# Patient Record
Sex: Male | Born: 1975 | Race: Black or African American | Hispanic: No | Marital: Married | Smoking: Former smoker
Health system: Southern US, Community
[De-identification: ages and names within clinical notes are randomized; demographics above are authoritative.]

## PROBLEM LIST (undated history)

## (undated) DIAGNOSIS — M1990 Unspecified osteoarthritis, unspecified site: Secondary | ICD-10-CM

---

## 1997-06-23 ENCOUNTER — Emergency Department (HOSPITAL_COMMUNITY): Admission: EM | Admit: 1997-06-23 | Discharge: 1997-06-23 | Payer: Self-pay | Admitting: Emergency Medicine

## 1997-07-19 ENCOUNTER — Emergency Department (HOSPITAL_COMMUNITY): Admission: AD | Admit: 1997-07-19 | Discharge: 1997-07-19 | Payer: Self-pay

## 1997-11-20 ENCOUNTER — Emergency Department (HOSPITAL_COMMUNITY): Admission: EM | Admit: 1997-11-20 | Discharge: 1997-11-20 | Payer: Self-pay | Admitting: Emergency Medicine

## 1998-03-09 ENCOUNTER — Emergency Department (HOSPITAL_COMMUNITY): Admission: EM | Admit: 1998-03-09 | Discharge: 1998-03-09 | Payer: Self-pay | Admitting: Emergency Medicine

## 1998-12-10 ENCOUNTER — Emergency Department (HOSPITAL_COMMUNITY): Admission: EM | Admit: 1998-12-10 | Discharge: 1998-12-10 | Payer: Self-pay | Admitting: Emergency Medicine

## 1998-12-11 ENCOUNTER — Emergency Department (HOSPITAL_COMMUNITY): Admission: EM | Admit: 1998-12-11 | Discharge: 1998-12-11 | Payer: Self-pay | Admitting: Emergency Medicine

## 1998-12-11 ENCOUNTER — Encounter: Payer: Self-pay | Admitting: Emergency Medicine

## 1998-12-13 ENCOUNTER — Inpatient Hospital Stay (HOSPITAL_COMMUNITY): Admission: EM | Admit: 1998-12-13 | Discharge: 1998-12-17 | Payer: Self-pay | Admitting: Emergency Medicine

## 1998-12-16 ENCOUNTER — Encounter: Payer: Self-pay | Admitting: Internal Medicine

## 1999-08-15 ENCOUNTER — Emergency Department (HOSPITAL_COMMUNITY): Admission: EM | Admit: 1999-08-15 | Discharge: 1999-08-15 | Payer: Self-pay | Admitting: Emergency Medicine

## 1999-08-18 ENCOUNTER — Encounter: Admission: RE | Admit: 1999-08-18 | Discharge: 1999-08-18 | Payer: Self-pay | Admitting: Hematology and Oncology

## 2001-06-12 ENCOUNTER — Emergency Department (HOSPITAL_COMMUNITY): Admission: EM | Admit: 2001-06-12 | Discharge: 2001-06-12 | Payer: Self-pay | Admitting: Emergency Medicine

## 2001-06-13 ENCOUNTER — Emergency Department (HOSPITAL_COMMUNITY): Admission: EM | Admit: 2001-06-13 | Discharge: 2001-06-13 | Payer: Self-pay | Admitting: Emergency Medicine

## 2001-10-15 ENCOUNTER — Emergency Department (HOSPITAL_COMMUNITY): Admission: EM | Admit: 2001-10-15 | Discharge: 2001-10-15 | Payer: Self-pay | Admitting: Emergency Medicine

## 2001-10-18 ENCOUNTER — Emergency Department (HOSPITAL_COMMUNITY): Admission: EM | Admit: 2001-10-18 | Discharge: 2001-10-18 | Payer: Self-pay | Admitting: Emergency Medicine

## 2002-08-05 ENCOUNTER — Emergency Department (HOSPITAL_COMMUNITY): Admission: EM | Admit: 2002-08-05 | Discharge: 2002-08-05 | Payer: Self-pay | Admitting: Emergency Medicine

## 2003-04-14 ENCOUNTER — Emergency Department (HOSPITAL_COMMUNITY): Admission: EM | Admit: 2003-04-14 | Discharge: 2003-04-14 | Payer: Self-pay | Admitting: Emergency Medicine

## 2010-03-28 ENCOUNTER — Emergency Department (HOSPITAL_COMMUNITY)
Admission: EM | Admit: 2010-03-28 | Discharge: 2010-03-29 | Disposition: A | Discharge status: Home / Self Care | Payer: Self-pay | Attending: Emergency Medicine | Admitting: Emergency Medicine

## 2010-03-28 DIAGNOSIS — R109 Unspecified abdominal pain: Secondary | ICD-10-CM | POA: Insufficient documentation

## 2010-03-28 DIAGNOSIS — R112 Nausea with vomiting, unspecified: Secondary | ICD-10-CM | POA: Insufficient documentation

## 2010-03-28 DIAGNOSIS — R509 Fever, unspecified: Secondary | ICD-10-CM | POA: Insufficient documentation

## 2010-03-29 LAB — URINALYSIS, ROUTINE W REFLEX MICROSCOPIC
Hgb urine dipstick: NEGATIVE
Ketones, ur: >80 mg/dL — AB
Leukocytes, UA: NEGATIVE
Nitrite: NEGATIVE
Protein, ur: 30 mg/dL — AB
Specific Gravity, Urine: 1.034 — ABNORMAL HIGH (ref 1.005–1.030)
Urine Glucose, Fasting: NEGATIVE mg/dL
Urobilinogen, UA: 1 mg/dL (ref 0.0–1.0)
pH: 7 (ref 5.0–8.0)

## 2010-03-29 LAB — CBC
HCT: 48.3 % (ref 39.0–52.0)
Hemoglobin: 16.3 g/dL (ref 13.0–17.0)
MCH: 29.1 pg (ref 26.0–34.0)
MCHC: 33.7 g/dL (ref 30.0–36.0)
MCV: 86.3 fL (ref 78.0–100.0)
Platelets: 170 10*3/uL (ref 150–400)
RBC: 5.6 MIL/uL (ref 4.22–5.81)
RDW: 14.3 % (ref 11.5–15.5)
WBC: 9.7 10*3/uL (ref 4.0–10.5)

## 2010-03-29 LAB — COMPREHENSIVE METABOLIC PANEL
ALT: 44 U/L (ref 0–53)
AST: 32 U/L (ref 0–37)
CO2: 24 mEq/L (ref 19–32)
Calcium: 9.7 mg/dL (ref 8.4–10.5)
Creatinine, Ser: 1.58 mg/dL — ABNORMAL HIGH (ref 0.4–1.5)
GFR calc Af Amer: >60 mL/min (ref >60)
GFR calc non Af Amer: 51 mL/min — ABNORMAL LOW (ref >60)
Sodium: 141 mEq/L (ref 135–145)
Total Protein: 8.8 g/dL — ABNORMAL HIGH (ref 6.0–8.3)

## 2010-03-29 LAB — URINE MICROSCOPIC-ADD ON

## 2010-03-29 LAB — DIFFERENTIAL
Basophils Absolute: 0 10*3/uL (ref 0.0–0.1)
Basophils Relative: 0 % (ref 0–1)
Eosinophils Absolute: 0.1 10*3/uL (ref 0.0–0.7)
Eosinophils Relative: 1 % (ref 0–5)
Lymphocytes Relative: 17 % (ref 12–46)
Lymphs Abs: 1.7 10*3/uL (ref 0.7–4.0)
Monocytes Absolute: 1 10*3/uL (ref 0.1–1.0)
Monocytes Relative: 11 % (ref 3–12)
Neutro Abs: 6.9 10*3/uL (ref 1.7–7.7)
Neutrophils Relative %: 71 % (ref 43–77)

## 2010-03-31 ENCOUNTER — Emergency Department (HOSPITAL_COMMUNITY): Payer: Self-pay

## 2010-03-31 ENCOUNTER — Emergency Department (HOSPITAL_COMMUNITY)
Admission: EM | Admit: 2010-03-31 | Discharge: 2010-03-31 | Disposition: A | Discharge status: Home / Self Care | Payer: Self-pay | Attending: Emergency Medicine | Admitting: Emergency Medicine

## 2010-03-31 DIAGNOSIS — R109 Unspecified abdominal pain: Secondary | ICD-10-CM | POA: Insufficient documentation

## 2010-03-31 DIAGNOSIS — R10819 Abdominal tenderness, unspecified site: Secondary | ICD-10-CM | POA: Insufficient documentation

## 2010-03-31 DIAGNOSIS — R112 Nausea with vomiting, unspecified: Secondary | ICD-10-CM | POA: Insufficient documentation

## 2010-03-31 LAB — DIFFERENTIAL
Basophils Absolute: 0.1 10*3/uL (ref 0.0–0.1)
Lymphocytes Relative: 28 % (ref 12–46)
Monocytes Relative: 9 % (ref 3–12)
Neutrophils Relative %: 62 % (ref 43–77)

## 2010-03-31 LAB — COMPREHENSIVE METABOLIC PANEL
ALT: 31 U/L (ref 0–53)
Albumin: 4.5 g/dL (ref 3.5–5.2)
Alkaline Phosphatase: 81 U/L (ref 39–117)
Chloride: 108 mEq/L (ref 96–112)
Glucose, Bld: 115 mg/dL — ABNORMAL HIGH (ref 70–99)
Potassium: 3.5 mEq/L (ref 3.5–5.1)
Sodium: 142 mEq/L (ref 135–145)
Total Bilirubin: 1.2 mg/dL (ref 0.3–1.2)
Total Protein: 8.8 g/dL — ABNORMAL HIGH (ref 6.0–8.3)

## 2010-03-31 LAB — URINALYSIS, ROUTINE W REFLEX MICROSCOPIC
Hgb urine dipstick: NEGATIVE
Ketones, ur: >80 mg/dL — AB
Protein, ur: 30 mg/dL — AB
Urine Glucose, Fasting: NEGATIVE mg/dL
Urobilinogen, UA: 1 mg/dL (ref 0.0–1.0)

## 2010-03-31 LAB — CBC
HCT: 48.4 % (ref 39.0–52.0)
Hemoglobin: 16.5 g/dL (ref 13.0–17.0)
RBC: 5.55 MIL/uL (ref 4.22–5.81)
RDW: 14.1 % (ref 11.5–15.5)
WBC: 9.7 10*3/uL (ref 4.0–10.5)

## 2010-03-31 LAB — URINE MICROSCOPIC-ADD ON

## 2010-07-05 ENCOUNTER — Emergency Department (HOSPITAL_COMMUNITY): Payer: Self-pay

## 2010-07-05 ENCOUNTER — Emergency Department (HOSPITAL_COMMUNITY)
Admission: EM | Admit: 2010-07-05 | Discharge: 2010-07-05 | Disposition: A | Discharge status: Home / Self Care | Payer: Self-pay | Attending: Emergency Medicine | Admitting: Emergency Medicine

## 2010-07-05 DIAGNOSIS — R112 Nausea with vomiting, unspecified: Secondary | ICD-10-CM | POA: Insufficient documentation

## 2010-07-05 DIAGNOSIS — R109 Unspecified abdominal pain: Secondary | ICD-10-CM | POA: Insufficient documentation

## 2010-07-05 LAB — CBC
Hemoglobin: 17.4 g/dL — ABNORMAL HIGH (ref 13.0–17.0)
MCH: 30.1 pg (ref 26.0–34.0)
RBC: 5.79 MIL/uL (ref 4.22–5.81)

## 2010-07-05 LAB — URINALYSIS, ROUTINE W REFLEX MICROSCOPIC
Bilirubin Urine: NEGATIVE
Hgb urine dipstick: NEGATIVE
Ketones, ur: >80 mg/dL — AB
Protein, ur: 30 mg/dL — AB
Urobilinogen, UA: 1 mg/dL (ref 0.0–1.0)

## 2010-07-05 LAB — OCCULT BLOOD, POC DEVICE: Fecal Occult Bld: NEGATIVE

## 2010-07-05 LAB — DIFFERENTIAL
Basophils Relative: 0 % (ref 0–1)
Lymphocytes Relative: 23 % (ref 12–46)
Lymphs Abs: 2.1 10*3/uL (ref 0.7–4.0)
Monocytes Relative: 11 % (ref 3–12)
Neutro Abs: 5.9 10*3/uL (ref 1.7–7.7)
Neutrophils Relative %: 65 % (ref 43–77)

## 2010-07-05 LAB — COMPREHENSIVE METABOLIC PANEL
ALT: 57 U/L — ABNORMAL HIGH (ref 0–53)
AST: 74 U/L — ABNORMAL HIGH (ref 0–37)
CO2: 22 mEq/L (ref 19–32)
Chloride: 99 mEq/L (ref 96–112)
Creatinine, Ser: 1.41 mg/dL (ref 0.4–1.5)
GFR calc Af Amer: >60 mL/min (ref >60)
GFR calc non Af Amer: 58 mL/min — ABNORMAL LOW (ref >60)
Total Bilirubin: 1 mg/dL (ref 0.3–1.2)

## 2010-07-05 MED ORDER — IOHEXOL 300 MG/ML  SOLN
125.0000 mL | Freq: Once | INTRAMUSCULAR | Status: AC | PRN
  Administered 2010-07-05: 125 mL via INTRAVENOUS

## 2012-08-16 ENCOUNTER — Emergency Department (HOSPITAL_COMMUNITY): Payer: No Typology Code available for payment source

## 2012-08-16 ENCOUNTER — Encounter (HOSPITAL_COMMUNITY): Payer: Self-pay | Admitting: Emergency Medicine

## 2012-08-16 ENCOUNTER — Emergency Department (HOSPITAL_COMMUNITY)
Admission: EM | Admit: 2012-08-16 | Discharge: 2012-08-16 | Disposition: A | Discharge status: Home / Self Care | Payer: No Typology Code available for payment source | Attending: Emergency Medicine | Admitting: Emergency Medicine

## 2012-08-16 DIAGNOSIS — Y92410 Unspecified street and highway as the place of occurrence of the external cause: Secondary | ICD-10-CM | POA: Insufficient documentation

## 2012-08-16 DIAGNOSIS — Z8739 Personal history of other diseases of the musculoskeletal system and connective tissue: Secondary | ICD-10-CM | POA: Insufficient documentation

## 2012-08-16 DIAGNOSIS — Y9389 Activity, other specified: Secondary | ICD-10-CM | POA: Insufficient documentation

## 2012-08-16 DIAGNOSIS — S0993XA Unspecified injury of face, initial encounter: Secondary | ICD-10-CM | POA: Insufficient documentation

## 2012-08-16 DIAGNOSIS — IMO0002 Reserved for concepts with insufficient information to code with codable children: Secondary | ICD-10-CM | POA: Insufficient documentation

## 2012-08-16 DIAGNOSIS — Z87891 Personal history of nicotine dependence: Secondary | ICD-10-CM | POA: Insufficient documentation

## 2012-08-16 HISTORY — DX: Unspecified osteoarthritis, unspecified site: M19.90

## 2012-08-16 MED ORDER — TRAMADOL HCL 50 MG PO TABS: 50.0000 mg | ORAL_TABLET | Freq: Four times a day (QID) | ORAL | Status: AC | PRN

## 2012-08-16 MED ORDER — KETOROLAC TROMETHAMINE 30 MG/ML IJ SOLN
30.0000 mg | Freq: Once | INTRAMUSCULAR | Status: AC
  Administered 2012-08-16: 30 mg via INTRAVENOUS
  Filled 2012-08-16: qty 1
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
## 2012-08-16 NOTE — ED Notes (Signed)
C-collar removed by Dr. Lockwood 

## 2012-08-16 NOTE — ED Notes (Signed)
Pt sts staff can throw away the razor blade, he doesn't need it anymore and doesn't know how he got it in his shoe. This RN put the blade in the sharps container.
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
## 2016-07-18 ENCOUNTER — Encounter (HOSPITAL_COMMUNITY): Payer: Self-pay | Admitting: Emergency Medicine

## 2016-07-18 ENCOUNTER — Emergency Department (HOSPITAL_COMMUNITY)
Admission: EM | Admit: 2016-07-18 | Discharge: 2016-07-18 | Disposition: A | Discharge status: Home / Self Care | Payer: Self-pay | Attending: Emergency Medicine | Admitting: Emergency Medicine

## 2016-07-18 DIAGNOSIS — S80811A Abrasion, right lower leg, initial encounter: Secondary | ICD-10-CM | POA: Insufficient documentation

## 2016-07-18 DIAGNOSIS — S81812A Laceration without foreign body, left lower leg, initial encounter: Secondary | ICD-10-CM | POA: Insufficient documentation

## 2016-07-18 DIAGNOSIS — Y999 Unspecified external cause status: Secondary | ICD-10-CM | POA: Insufficient documentation

## 2016-07-18 DIAGNOSIS — R0789 Other chest pain: Secondary | ICD-10-CM | POA: Insufficient documentation

## 2016-07-18 DIAGNOSIS — Y929 Unspecified place or not applicable: Secondary | ICD-10-CM | POA: Insufficient documentation

## 2016-07-18 DIAGNOSIS — Z23 Encounter for immunization: Secondary | ICD-10-CM | POA: Insufficient documentation

## 2016-07-18 DIAGNOSIS — Z87891 Personal history of nicotine dependence: Secondary | ICD-10-CM | POA: Insufficient documentation

## 2016-07-18 DIAGNOSIS — Z79899 Other long term (current) drug therapy: Secondary | ICD-10-CM | POA: Insufficient documentation

## 2016-07-18 DIAGNOSIS — Y939 Activity, unspecified: Secondary | ICD-10-CM | POA: Insufficient documentation

## 2016-07-18 MED ORDER — TETANUS-DIPHTH-ACELL PERTUSSIS 5-2.5-18.5 LF-MCG/0.5 IM SUSP: 0.5000 mL | Freq: Once | INTRAMUSCULAR | Status: DC

## 2016-07-18 MED ORDER — LIDOCAINE-EPINEPHRINE (PF) 2 %-1:200000 IJ SOLN
20.0000 mL | Freq: Once | INTRAMUSCULAR | Status: DC
Start: 2016-07-18 — End: 2016-07-18

## 2016-07-18 NOTE — ED Notes (Signed)
Wound cleaned and flushed with NS.  Slight closure provided with ster strips.  Xeroform dressing with 4/4 applied.  Secured.  Pt refused Tdap injection.
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
## 2017-04-01 ENCOUNTER — Emergency Department (HOSPITAL_COMMUNITY): Payer: Self-pay

## 2017-04-01 ENCOUNTER — Emergency Department (HOSPITAL_COMMUNITY)
Admission: EM | Admit: 2017-04-01 | Discharge: 2017-04-01 | Disposition: A | Discharge status: Home / Self Care | Payer: Self-pay | Attending: Emergency Medicine | Admitting: Emergency Medicine

## 2017-04-01 DIAGNOSIS — R6889 Other general symptoms and signs: Secondary | ICD-10-CM

## 2017-04-01 DIAGNOSIS — J111 Influenza due to unidentified influenza virus with other respiratory manifestations: Secondary | ICD-10-CM | POA: Insufficient documentation

## 2017-04-01 DIAGNOSIS — Z87891 Personal history of nicotine dependence: Secondary | ICD-10-CM | POA: Insufficient documentation

## 2017-04-01 LAB — CBC WITH DIFFERENTIAL/PLATELET
BASOS ABS: 0 10*3/uL (ref 0.0–0.1)
BASOS PCT: 0 %
Eosinophils Absolute: 0 10*3/uL (ref 0.0–0.7)
Eosinophils Relative: 0 %
HEMATOCRIT: 46.3 % (ref 39.0–52.0)
HEMOGLOBIN: 16 g/dL (ref 13.0–17.0)
LYMPHS PCT: 21 %
Lymphs Abs: 1.8 10*3/uL (ref 0.7–4.0)
MCH: 30.5 pg (ref 26.0–34.0)
MCHC: 34.6 g/dL (ref 30.0–36.0)
MCV: 88.4 fL (ref 78.0–100.0)
MONOS PCT: 4 %
Monocytes Absolute: 0.3 10*3/uL (ref 0.1–1.0)
NEUTROS PCT: 75 %
Neutro Abs: 6.4 10*3/uL (ref 1.7–7.7)
Platelets: 153 10*3/uL (ref 150–400)
RBC: 5.24 MIL/uL (ref 4.22–5.81)
RDW: 14.3 % (ref 11.5–15.5)
WBC: 8.5 10*3/uL (ref 4.0–10.5)

## 2017-04-01 LAB — COMPREHENSIVE METABOLIC PANEL
ALBUMIN: 4.3 g/dL (ref 3.5–5.0)
ALK PHOS: 76 U/L (ref 38–126)
ALT: 28 U/L (ref 17–63)
ANION GAP: 14 (ref 5–15)
AST: 32 U/L (ref 15–41)
BUN: 18 mg/dL (ref 6–20)
CALCIUM: 9.2 mg/dL (ref 8.9–10.3)
CO2: 16 mmol/L — AB (ref 22–32)
Chloride: 106 mmol/L (ref 101–111)
Creatinine, Ser: 2.26 mg/dL — ABNORMAL HIGH (ref 0.61–1.24)
GFR calc non Af Amer: 35 mL/min — ABNORMAL LOW (ref >60)
GFR, EST AFRICAN AMERICAN: 40 mL/min — AB (ref >60)
Glucose, Bld: 121 mg/dL — ABNORMAL HIGH (ref 65–99)
POTASSIUM: 3 mmol/L — AB (ref 3.5–5.1)
SODIUM: 136 mmol/L (ref 135–145)
Total Bilirubin: 0.7 mg/dL (ref 0.3–1.2)
Total Protein: 8.4 g/dL — ABNORMAL HIGH (ref 6.5–8.1)

## 2017-04-01 LAB — I-STAT CG4 LACTIC ACID, ED: LACTIC ACID, VENOUS: 1.19 mmol/L (ref 0.5–1.9)

## 2017-04-01 MED ORDER — ACETAMINOPHEN 325 MG PO TABS
650.0000 mg | ORAL_TABLET | Freq: Once | ORAL | Status: AC
  Administered 2017-04-01: 650 mg via ORAL
  Filled 2017-04-01: qty 2

## 2017-04-01 MED ORDER — ONDANSETRON 4 MG PO TBDP
4.0000 mg | ORAL_TABLET | Freq: Once | ORAL | Status: AC
  Administered 2017-04-01: 4 mg via ORAL
  Filled 2017-04-01: qty 1

## 2017-04-01 MED ORDER — POTASSIUM CHLORIDE 20 MEQ/15ML (10%) PO SOLN
40.0000 meq | Freq: Once | ORAL | Status: AC
  Administered 2017-04-01: 40 meq via ORAL
  Filled 2017-04-01: qty 30

## 2017-04-01 MED ORDER — ONDANSETRON HCL 4 MG PO TABS: 4.0000 mg | ORAL_TABLET | Freq: Three times a day (TID) | ORAL | 0 refills | Status: AC | PRN

## 2017-04-01 MED ORDER — SODIUM CHLORIDE 0.9 % IV BOLUS (SEPSIS)
1000.0000 mL | Freq: Once | INTRAVENOUS | Status: AC
  Administered 2017-04-01: 1000 mL via INTRAVENOUS

## 2017-04-01 MED ORDER — IBUPROFEN 800 MG PO TABS
800.0000 mg | ORAL_TABLET | Freq: Once | ORAL | Status: AC
  Administered 2017-04-01: 800 mg via ORAL
  Filled 2017-04-01: qty 1

## 2017-04-01 MED ORDER — IPRATROPIUM-ALBUTEROL 0.5-2.5 (3) MG/3ML IN SOLN
3.0000 mL | Freq: Once | RESPIRATORY_TRACT | Status: AC
  Administered 2017-04-01: 3 mL via RESPIRATORY_TRACT
  Filled 2017-04-01: qty 3

## 2017-04-01 NOTE — ED Provider Notes (Signed)
[facility] Provider Note   CSN: [id number] Arrival date & time: 04/01/17  1310     History   Chief Complaint No chief complaint on file.   HPI Andres Pace is a 42 y.o. male.  HPI  42 year old male no significant past medical history presents the emergency department with 48 hours of nausea/vomiting/diarrhea along with productive cough and associated fever 102.  Patient presents to department with generalized weakness.  Positive sick contacts for flulike illness.  Past Medical History:  Diagnosis Date  . Arthritis     There are no active problems to display for this patient.   No past surgical history on file.     Home Medications    Prior to Admission medications   Medication Sig Start Date End Date Taking? Authorizing Provider  traMADol (ULTRAM) 50 MG tablet Take 1 tablet (50 mg total) by mouth every 6 (six) hours as needed for pain. 08/16/12   Gerhard MunchLockwood, Robert, MD    Family History No family history on file.  Social History Social History   Tobacco Use  . Smoking status: Former Smoker  Substance Use Topics  . Alcohol use: Yes    Comment: weekends  . Drug use: Yes    Types: Marijuana     Allergies   Patient has no known allergies.   Review of Systems Review of Systems  Review of Systems  Constitutional: see HPI HENT: Negative for ear pain, sore throat and trouble swallowing.   Eyes: Negative for pain and visual disturbance.  Respiratory: Negative for cough and shortness of breath.   Cardiovascular: Negative for chest pain and leg swelling.  Gastrointestinal: see HPI; neg abdominal pain Genitourinary: Negative for dysuria, urgency and frequency.  Musculoskeletal: Negative for back pain and joint swelling.  Skin: Negative for rash and wound.  Neurological: Negative for dizziness, syncope, speech difficulty, weakness and numbness.   Physical Exam Updated Vital Signs BP 131/85   Pulse 98   Temp  (!) 103 F (39.4 C) (Oral)   Resp 17   SpO2 98%   Physical Exam  Physical Exam Vitals:   04/01/17 1603 04/01/17 1630  BP: 127/81 124/81  Pulse: 81 75  Resp: 16   Temp: (!) 101.4 F (38.6 C)   SpO2: 99% 95%   Constitutional: Patient is in no acute distress Head: Normocephalic and atraumatic.  Eyes: Extraocular motion intact, no scleral icterus Neck: Supple without meningismus, mass, or overt JVD Respiratory: Diminished lung sounds bilateral with respiratory wheeze.   No respiratory distress. CV: Heart regular rate and rhythm, no obvious murmurs.  Pulses +2 and symmetric Abdomen: Soft, non-tender, non-distended MSK: Extremities are atraumatic without deformity, ROM intact Skin: Warm, dry, intact Neuro: Alert and oriented, no motor deficit noted Psychiatric: Mood and affect are normal.  ED Treatments / Results  Labs (all labs ordered are listed, but only abnormal results are displayed) Labs Reviewed  CBC WITH DIFFERENTIAL/PLATELET  COMPREHENSIVE METABOLIC PANEL  URINALYSIS, ROUTINE W REFLEX MICROSCOPIC  I-STAT CG4 LACTIC ACID, ED  I-STAT CG4 LACTIC ACID, ED    EKG  EKG Interpretation None       Radiology Dg Chest 2 View  Result Date: 04/01/2017 CLINICAL DATA:  Cough, shortness of breath. EXAM: CHEST  2 VIEW COMPARISON:  None. FINDINGS: The heart size and mediastinal contours are within normal limits. Both lungs are clear. No pneumothorax or pleural effusion is noted. The visualized skeletal structures are unremarkable. IMPRESSION: No active cardiopulmonary disease. Electronically Signed  By: Lupita Raider, M.D.   On: 04/01/2017 13:49    Procedures Procedures (including critical care time)  Medications Ordered in ED Medications  acetaminophen (TYLENOL) tablet 650 mg (650 mg Oral Given 04/01/17 1416)  ondansetron (ZOFRAN-ODT) disintegrating tablet 4 mg (4 mg Oral Given 04/01/17 1416)     Initial Impression / Assessment and Plan / ED Course  I have reviewed  the triage vital signs and the nursing notes.  Pertinent labs & imaging results that were available during my care of the patient were reviewed by me and considered in my medical decision making (see chart for details).     42 year old male no significant past medical history presents the emergency department with 48 hours of nausea/vomiting/diarrhea along with productive cough and associated fever 102.  Patient presents to department with generalized weakness.  Positive sick contacts for flulike illness.  Patient arrives tachycardic and febrile to 103.  No evidence of leukocytosis, stable H&H, known chronic kidney disease with renal function slightly worsened from baseline 2.26, lactic acid 1.19.  Review of chest x-ray no findings concerning for pneumonia.  Patient was given 1 L normal saline bolus in the emergency department along with Zofran.  Patient tolerates p.o.  Patient had a mild hypokalemia was given 40 mEq of potassium in the ED.  Final Clinical Impressions(s) / ED Diagnoses   Final diagnoses:  None    ED Discharge Orders    None      Jaynie Collins, DO 04/02/17 1131  Doug Sou, MD 04/02/17 1500

## 2017-04-01 NOTE — ED Triage Notes (Signed)
Body aches, n/v/d and confusion since yesterday, now AOx4

## 2017-04-01 NOTE — ED Notes (Signed)
Pt refused to use wheelchair.  Was walking, but almost fell.  Placed in wheelchair.

## 2017-04-01 NOTE — ED Provider Notes (Signed)
Patient with cough nasal congestion onset yesterday accompanied by one episode of vomiting and 3 or 4 episodes of diarrhea.  He feels much improved since treatment here.  Patient is alert no distress.  Positive nasal congestion lungs clear to auscultation heart regular rate and rhythm abdomen soft nontender.  Patient noted to have renal insufficiency and hypokalemia.  He has no primary care physician.  Case management consulted to arrange for close follow-up   Doug SouJacubowitz, Jessenya Berdan, MD 04/01/17 1746

## 2017-04-01 NOTE — Discharge Instructions (Signed)
Follow-up with your primary care provider after you have established evaluation.  The next 72 hours and particularly pay attention to your worsening renal function.  You should drink 11 ounces of water daily for fluid resuscitation.

## 2019-07-14 IMAGING — DX DG CHEST 2V
2 series · 2 of 2 positions shown · non-contrast
Comparison: None.

CLINICAL DATA: Cough, shortness of breath.

EXAM:
CHEST  2 VIEW

[chest pa]
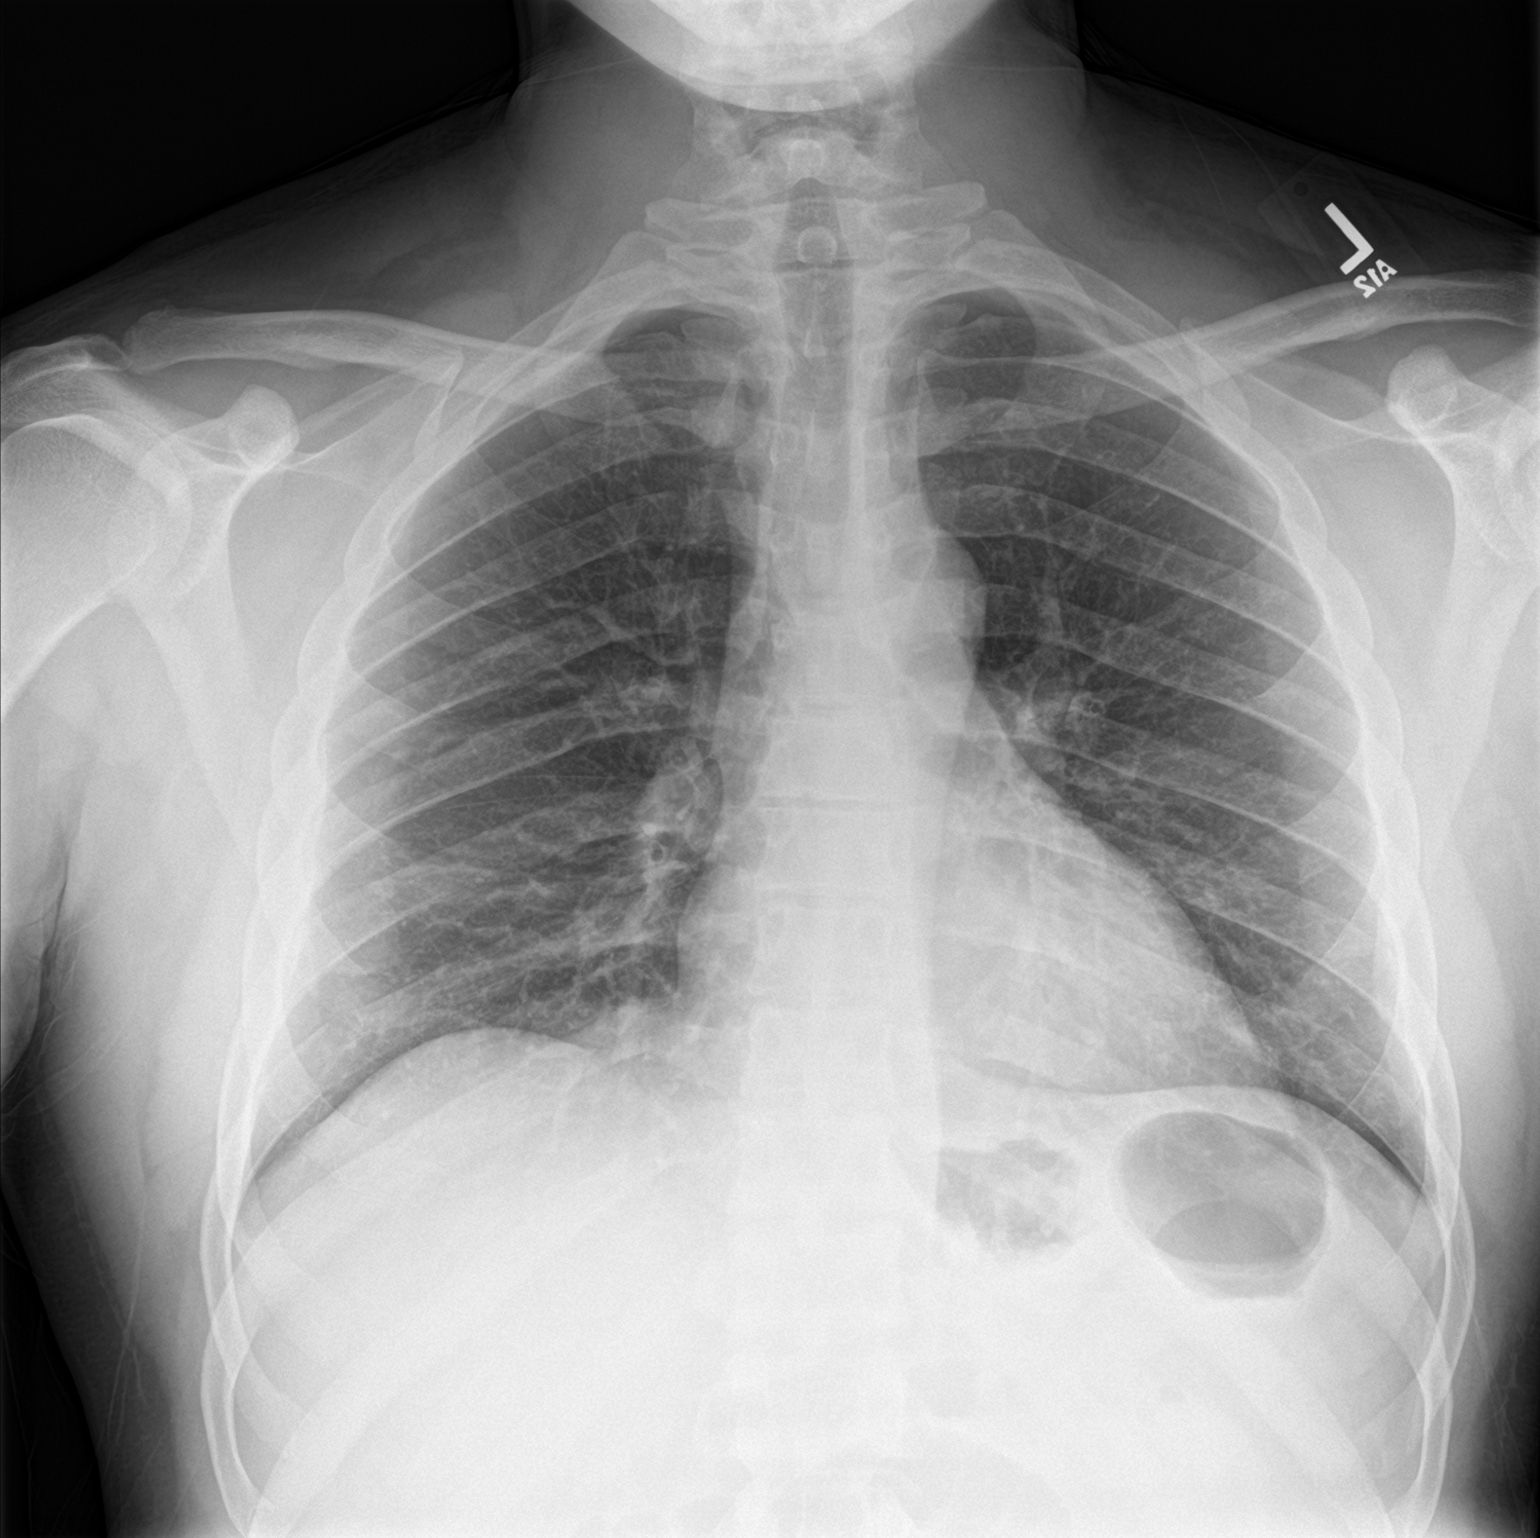

[chest lat]
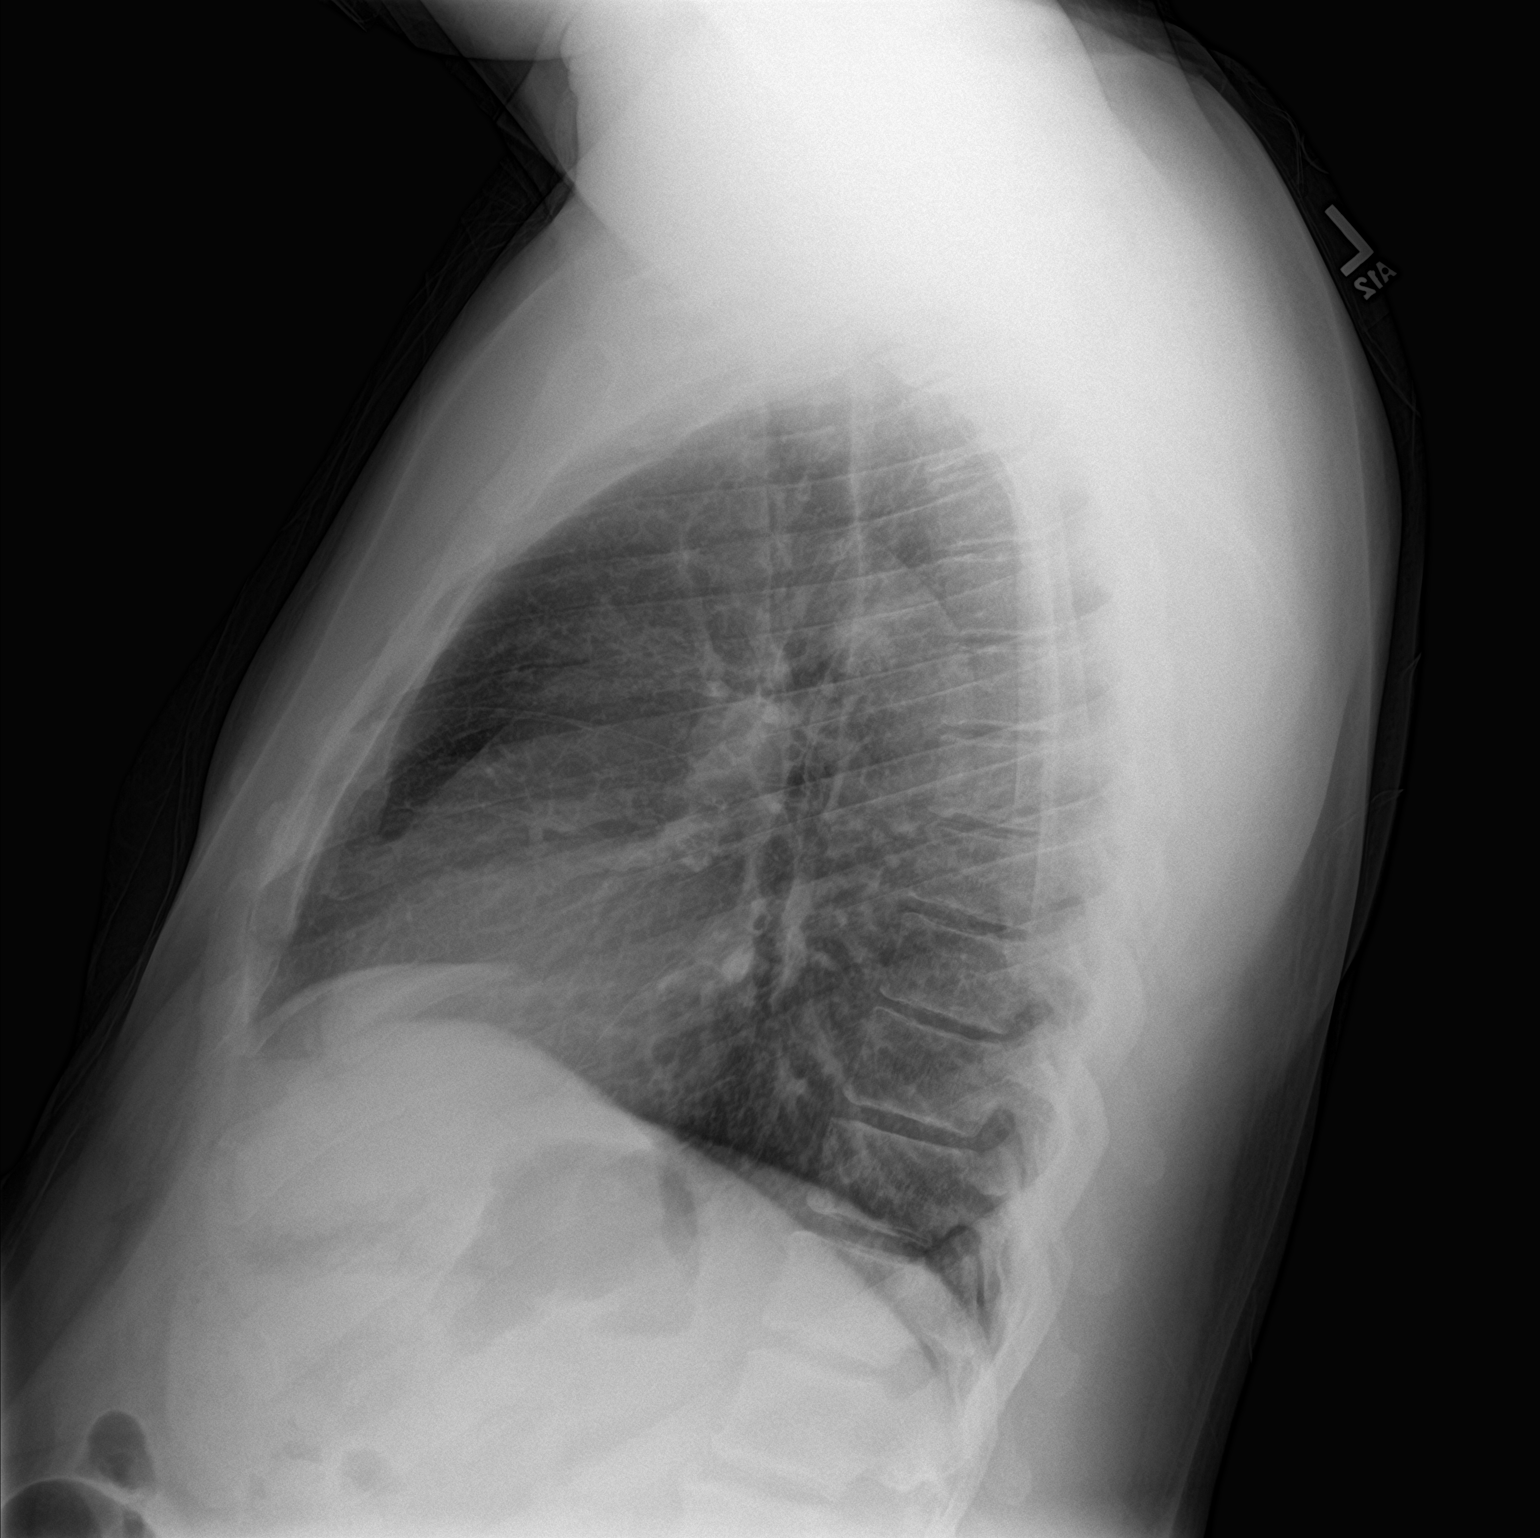

[2 of 2 positions shown; findings below may reference images not displayed]

FINDINGS: The heart size and mediastinal contours are within normal limits.
Both lungs are clear. No pneumothorax or pleural effusion is noted.
The visualized skeletal structures are unremarkable.
IMPRESSION: No active cardiopulmonary disease.

## 2021-09-21 ENCOUNTER — Ambulatory Visit: Payer: Managed Care, Other (non HMO) | Admitting: Podiatry

## 2022-08-31 ENCOUNTER — Encounter: Payer: Self-pay | Admitting: Emergency Medicine

## 2022-08-31 ENCOUNTER — Other Ambulatory Visit: Payer: Self-pay

## 2022-08-31 ENCOUNTER — Ambulatory Visit
Admission: EM | Admit: 2022-08-31 | Discharge: 2022-08-31 | Disposition: A | Discharge status: Home / Self Care | Payer: Self-pay | Attending: Internal Medicine | Admitting: Internal Medicine

## 2022-08-31 ENCOUNTER — Ambulatory Visit (INDEPENDENT_AMBULATORY_CARE_PROVIDER_SITE_OTHER): Payer: Self-pay

## 2022-08-31 DIAGNOSIS — M25512 Pain in left shoulder: Secondary | ICD-10-CM

## 2022-08-31 MED ORDER — METHOCARBAMOL 500 MG PO TABS: 500.0000 mg | ORAL_TABLET | Freq: Two times a day (BID) | ORAL | 0 refills | Status: AC | PRN

## 2022-08-31 MED ORDER — PREDNISONE 20 MG PO TABS: 40.0000 mg | ORAL_TABLET | Freq: Every day | ORAL | 0 refills | Status: AC

## 2022-08-31 NOTE — Discharge Instructions (Addendum)
I have prescribed prednisone and a muscle relaxer to take for shoulder pain.  Please be advised that muscle relaxer can be drowsy so no driving or drinking alcohol with it.  Follow-up with orthopedist if symptoms persist or worsen. I will call with results.

## 2022-08-31 NOTE — ED Provider Notes (Signed)
[facility]    CSN: [id number] Arrival date & time: 08/31/22  1312      History   Chief Complaint Chief Complaint  Patient presents with   Shoulder Pain    HPI Andres Pace is a 47 y.o. male.   Patient presents with left shoulder pain that has been present for multiple weeks.  He reports that he thinks he may have injured it with helping a friend lift heavy boxes a few weeks prior.  Denies numbness or tingling.  Pain is present in the anterior portion of the shoulder and movement elicits pain.  He denies any fever associated with it. He has not taken any medication for pain.   Shoulder Pain   Past Medical History:  Diagnosis Date   Arthritis     There are no problems to display for this patient.   History reviewed. No pertinent surgical history.     Home Medications    Prior to Admission medications   Medication Sig Start Date End Date Taking? Authorizing Provider  methocarbamol (ROBAXIN) 500 MG tablet Take 1 tablet (500 mg total) by mouth 2 (two) times daily as needed for muscle spasms. 08/31/22  Yes Tiarra Anastacio, Rolly Salter E, FNP  predniSONE (DELTASONE) 20 MG tablet Take 2 tablets (40 mg total) by mouth daily for 5 days. 08/31/22 09/05/22 Yes Deborha Moseley, Acie Fredrickson, FNP  ondansetron (ZOFRAN) 4 MG tablet Take 1 tablet (4 mg total) by mouth every 8 (eight) hours as needed for nausea or vomiting. 04/01/17   Jaynie Collins, DO  traMADol (ULTRAM) 50 MG tablet Take 1 tablet (50 mg total) by mouth every 6 (six) hours as needed for pain. 08/16/12   Gerhard Munch, MD    Family History History reviewed. No pertinent family history.  Social History Social History   Tobacco Use   Smoking status: Former  Substance Use Topics   Alcohol use: Yes    Comment: weekends   Drug use: Yes    Types: Marijuana     Allergies   Patient has no known allergies.   Review of Systems Review of Systems Per HPI  Physical Exam Triage Vital Signs ED Triage Vitals [08/31/22 1403]   Encounter Vitals Group     BP (!) 166/90     Systolic BP Percentile      Diastolic BP Percentile      Pulse Rate 76     Resp 18     Temp 98.5 F (36.9 C)     Temp Source Oral     SpO2 98 %     Weight      Height      Head Circumference      Peak Flow      Pain Score 5     Pain Loc      Pain Education      Exclude from Growth Chart    No data found.  Updated Vital Signs BP (!) 166/90 (BP Location: Left Arm)   Pulse 76   Temp 98.5 F (36.9 C) (Oral)   Resp 18   SpO2 98%   Visual Acuity Right Eye Distance:   Left Eye Distance:   Bilateral Distance:    Right Eye Near:   Left Eye Near:    Bilateral Near:     Physical Exam Constitutional:      General: He is not in acute distress.    Appearance: Normal appearance. He is not toxic-appearing or diaphoretic.  HENT:  Head: Normocephalic and atraumatic.  Eyes:     Extraocular Movements: Extraocular movements intact.     Conjunctiva/sclera: Conjunctivae normal.  Pulmonary:     Effort: Pulmonary effort is normal.  Musculoskeletal:     Comments: Patient has tenderness to palpation to anterior left shoulder with no obvious swelling, crepitus, discoloration, warmth.  Patient has pain with abduction of left arm at about 90 degrees.  Grip strength is 5/5.  Patient is neurovascularly intact.  Neurological:     General: No focal deficit present.     Mental Status: He is alert and oriented to person, place, and time. Mental status is at baseline.  Psychiatric:        Mood and Affect: Mood normal.        Behavior: Behavior normal.        Thought Content: Thought content normal.        Judgment: Judgment normal.      UC Treatments / Results  Labs (all labs ordered are listed, but only abnormal results are displayed) Labs Reviewed - No data to display  EKG   Radiology DG Shoulder Left  Result Date: 08/31/2022 CLINICAL DATA:  Pain left shoulder EXAM: LEFT SHOULDER - 2+ VIEW COMPARISON:  None Available. FINDINGS:  There is no evidence of fracture or dislocation. There is no evidence of arthropathy or other focal bone abnormality. Soft tissues are unremarkable. IMPRESSION: No radiographic abnormalities are seen in left shoulder. Electronically Signed   By: Ernie Avena M.D.   On: 08/31/2022 14:57    Procedures Procedures (including critical care time)  Medications Ordered in UC Medications - No data to display  Initial Impression / Assessment and Plan / UC Course  I have reviewed the triage vital signs and the nursing notes.  Pertinent labs & imaging results that were available during my care of the patient were reviewed by me and considered in my medical decision making (see chart for details).     Highly suspect muscular etiology.  X-ray was completed that was negative for any acute bony abnormality. Discussed x-ray results with patient. Will prescribe prednisone to decrease inflammation as well as a muscle relaxer.  Patient advised that muscle relaxer can make him drowsy and do not drive or drink alcohol with taking it.  Advised supportive care and following with orthopedist at provided contact information if symptoms persist or worsen.  Patient verbalized understanding and was agreeable with plan. Final Clinical Impressions(s) / UC Diagnoses   Final diagnoses:  Acute pain of left shoulder     Discharge Instructions      I have prescribed prednisone and a muscle relaxer to take for shoulder pain.  Please be advised that muscle relaxer can be drowsy so no driving or drinking alcohol with it.  Follow-up with orthopedist if symptoms persist or worsen. I will call with results.     ED Prescriptions     Medication Sig Dispense Auth. Provider   predniSONE (DELTASONE) 20 MG tablet Take 2 tablets (40 mg total) by mouth daily for 5 days. 10 tablet [place], [place], [place]   methocarbamol (ROBAXIN) 500 MG tablet Take 1 tablet (500 mg total) by mouth 2 (two) times daily as needed for muscle spasms.  20 tablet [place], Acie Fredrickson, [place]      PDMP not reviewed this encounter.   Gustavus Bryant, [place] 08/31/22 [phone]

## 2022-08-31 NOTE — ED Triage Notes (Signed)
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
